# Patient Record
Sex: Male | Born: 1950 | Race: White | Hispanic: No | Marital: Single | Smoking: Never smoker
Health system: Southern US, Community
[De-identification: ages and names within clinical notes are randomized; demographics above are authoritative.]

## PROBLEM LIST (undated history)

## (undated) DIAGNOSIS — K219 Gastro-esophageal reflux disease without esophagitis: Secondary | ICD-10-CM

## (undated) DIAGNOSIS — G4700 Insomnia, unspecified: Secondary | ICD-10-CM

---

## 2010-07-27 ENCOUNTER — Emergency Department (INDEPENDENT_AMBULATORY_CARE_PROVIDER_SITE_OTHER): Payer: No Typology Code available for payment source

## 2010-07-27 ENCOUNTER — Emergency Department (HOSPITAL_BASED_OUTPATIENT_CLINIC_OR_DEPARTMENT_OTHER)
Admission: EM | Admit: 2010-07-27 | Discharge: 2010-07-27 | Disposition: A | Discharge status: Home / Self Care | Payer: No Typology Code available for payment source | Attending: Emergency Medicine | Admitting: Emergency Medicine

## 2010-07-27 DIAGNOSIS — M542 Cervicalgia: Secondary | ICD-10-CM

## 2010-07-27 DIAGNOSIS — E7800 Pure hypercholesterolemia, unspecified: Secondary | ICD-10-CM | POA: Insufficient documentation

## 2010-07-27 DIAGNOSIS — K219 Gastro-esophageal reflux disease without esophagitis: Secondary | ICD-10-CM | POA: Insufficient documentation

## 2010-07-27 DIAGNOSIS — Y92410 Unspecified street and highway as the place of occurrence of the external cause: Secondary | ICD-10-CM | POA: Insufficient documentation

## 2010-10-13 ENCOUNTER — Emergency Department (HOSPITAL_BASED_OUTPATIENT_CLINIC_OR_DEPARTMENT_OTHER)
Admission: EM | Admit: 2010-10-13 | Discharge: 2010-10-13 | Disposition: A | Discharge status: Home / Self Care | Payer: 59 | Attending: Emergency Medicine | Admitting: Emergency Medicine

## 2010-10-13 DIAGNOSIS — K219 Gastro-esophageal reflux disease without esophagitis: Secondary | ICD-10-CM | POA: Insufficient documentation

## 2010-10-13 DIAGNOSIS — L03019 Cellulitis of unspecified finger: Secondary | ICD-10-CM | POA: Insufficient documentation

## 2010-10-13 DIAGNOSIS — E7800 Pure hypercholesterolemia, unspecified: Secondary | ICD-10-CM | POA: Insufficient documentation

## 2018-03-15 ENCOUNTER — Emergency Department (HOSPITAL_BASED_OUTPATIENT_CLINIC_OR_DEPARTMENT_OTHER): Payer: Managed Care, Other (non HMO)

## 2018-03-15 ENCOUNTER — Other Ambulatory Visit: Payer: Self-pay

## 2018-03-15 ENCOUNTER — Encounter (HOSPITAL_BASED_OUTPATIENT_CLINIC_OR_DEPARTMENT_OTHER): Payer: Self-pay | Admitting: *Deleted

## 2018-03-15 ENCOUNTER — Emergency Department (HOSPITAL_BASED_OUTPATIENT_CLINIC_OR_DEPARTMENT_OTHER)
Admission: EM | Admit: 2018-03-15 | Discharge: 2018-03-15 | Disposition: A | Discharge status: Home / Self Care | Payer: Managed Care, Other (non HMO) | Attending: Emergency Medicine | Admitting: Emergency Medicine

## 2018-03-15 DIAGNOSIS — H9312 Tinnitus, left ear: Secondary | ICD-10-CM | POA: Diagnosis present

## 2018-03-15 DIAGNOSIS — Y9389 Activity, other specified: Secondary | ICD-10-CM | POA: Insufficient documentation

## 2018-03-15 DIAGNOSIS — Y998 Other external cause status: Secondary | ICD-10-CM | POA: Diagnosis not present

## 2018-03-15 DIAGNOSIS — R200 Anesthesia of skin: Secondary | ICD-10-CM | POA: Diagnosis not present

## 2018-03-15 DIAGNOSIS — Y92410 Unspecified street and highway as the place of occurrence of the external cause: Secondary | ICD-10-CM | POA: Insufficient documentation

## 2018-03-15 DIAGNOSIS — W2210XA Striking against or struck by unspecified automobile airbag, initial encounter: Secondary | ICD-10-CM | POA: Diagnosis not present

## 2018-03-15 DIAGNOSIS — H748X2 Other specified disorders of left middle ear and mastoid: Secondary | ICD-10-CM

## 2018-03-15 DIAGNOSIS — Z79899 Other long term (current) drug therapy: Secondary | ICD-10-CM | POA: Insufficient documentation

## 2018-03-15 HISTORY — DX: Insomnia, unspecified: G47.00

## 2018-03-15 HISTORY — DX: Gastro-esophageal reflux disease without esophagitis: K21.9

## 2018-03-15 MED ORDER — MELOXICAM 15 MG PO TABS: 15.0000 mg | ORAL_TABLET | Freq: Every day | ORAL | 0 refills | Status: AC

## 2018-03-15 MED ORDER — CYCLOBENZAPRINE HCL 10 MG PO TABS: 5.0000 mg | ORAL_TABLET | Freq: Two times a day (BID) | ORAL | 0 refills | Status: AC | PRN

## 2018-03-15 MED FILL — MELOXICAM 15 MG TABLET: 15 | 10 days supply | Qty: 10 | Fill #0

## 2018-03-15 MED FILL — CYCLOBENZAPRINE HCL 10 MG T: 10 | 10 days supply | Qty: 20 | Fill #0
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
## 2018-03-15 NOTE — Discharge Instructions (Addendum)
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
## 2019-06-16 ENCOUNTER — Ambulatory Visit: Payer: Managed Care, Other (non HMO)

## 2019-06-24 ENCOUNTER — Ambulatory Visit: Payer: Managed Care, Other (non HMO)
